# Patient Record
Sex: Female | Born: 1975 | Race: Black or African American | Hispanic: No | Marital: Married | Smoking: Never smoker
Health system: Southern US, Community
[De-identification: ages and names within clinical notes are randomized; demographics above are authoritative.]

## PROBLEM LIST (undated history)

## (undated) DIAGNOSIS — I10 Essential (primary) hypertension: Secondary | ICD-10-CM

## (undated) DIAGNOSIS — E119 Type 2 diabetes mellitus without complications: Secondary | ICD-10-CM

## (undated) HISTORY — PX: CHOLECYSTECTOMY: SHX55

---

## 2006-02-27 ENCOUNTER — Other Ambulatory Visit: Admission: RE | Admit: 2006-02-27 | Discharge: 2006-02-27 | Payer: Self-pay | Admitting: Gynecology

## 2007-06-10 ENCOUNTER — Other Ambulatory Visit: Admission: RE | Admit: 2007-06-10 | Discharge: 2007-06-10 | Payer: Self-pay | Admitting: Gynecology

## 2011-08-13 ENCOUNTER — Encounter (HOSPITAL_COMMUNITY): Payer: Self-pay | Admitting: Emergency Medicine

## 2011-08-13 ENCOUNTER — Emergency Department (HOSPITAL_COMMUNITY)
Admission: EM | Admit: 2011-08-13 | Discharge: 2011-08-13 | Disposition: A | Discharge status: Home / Self Care | Payer: BC Managed Care – PPO | Attending: Emergency Medicine | Admitting: Emergency Medicine

## 2011-08-13 ENCOUNTER — Emergency Department (HOSPITAL_COMMUNITY): Payer: BC Managed Care – PPO

## 2011-08-13 DIAGNOSIS — R1011 Right upper quadrant pain: Secondary | ICD-10-CM | POA: Insufficient documentation

## 2011-08-13 DIAGNOSIS — R109 Unspecified abdominal pain: Secondary | ICD-10-CM

## 2011-08-13 LAB — URINALYSIS, ROUTINE W REFLEX MICROSCOPIC
Glucose, UA: NEGATIVE mg/dL
Ketones, ur: NEGATIVE mg/dL
Leukocytes, UA: NEGATIVE
Nitrite: NEGATIVE
Protein, ur: NEGATIVE mg/dL
Urobilinogen, UA: 0.2 mg/dL (ref 0.0–1.0)

## 2011-08-13 LAB — COMPREHENSIVE METABOLIC PANEL
ALT: 21 U/L (ref 0–35)
AST: 17 U/L (ref 0–37)
Albumin: 3.7 g/dL (ref 3.5–5.2)
CO2: 27 mEq/L (ref 19–32)
Chloride: 101 mEq/L (ref 96–112)
Creatinine, Ser: 0.69 mg/dL (ref 0.50–1.10)
GFR calc non Af Amer: >90 mL/min (ref >90)
Sodium: 137 mEq/L (ref 135–145)
Total Bilirubin: 0.4 mg/dL (ref 0.3–1.2)

## 2011-08-13 LAB — CBC WITH DIFFERENTIAL/PLATELET
Basophils Absolute: 0 10*3/uL (ref 0.0–0.1)
Basophils Relative: 0 % (ref 0–1)
HCT: 38 % (ref 36.0–46.0)
Lymphocytes Relative: 45 % (ref 12–46)
MCHC: 32.9 g/dL (ref 30.0–36.0)
Monocytes Absolute: 0.4 10*3/uL (ref 0.1–1.0)
Neutro Abs: 2.6 10*3/uL (ref 1.7–7.7)
Neutrophils Relative %: 46 % (ref 43–77)
Platelets: 312 10*3/uL (ref 150–400)
RDW: 12.7 % (ref 11.5–15.5)
WBC: 5.6 10*3/uL (ref 4.0–10.5)

## 2011-08-13 LAB — PREGNANCY, URINE: Preg Test, Ur: NEGATIVE

## 2011-08-13 MED ORDER — MORPHINE SULFATE 4 MG/ML IJ SOLN
4.0000 mg | INTRAMUSCULAR | Status: DC | PRN
Start: 2011-08-13 — End: 2011-08-13
  Administered 2011-08-13: 4 mg via INTRAVENOUS
  Filled 2011-08-13: qty 1

## 2011-08-13 MED ORDER — IOHEXOL 300 MG/ML  SOLN
40.0000 mL | Freq: Once | INTRAMUSCULAR | Status: AC | PRN
  Administered 2011-08-13: 40 mL via ORAL

## 2011-08-13 MED ORDER — SODIUM CHLORIDE 0.9 % IV SOLN
INTRAVENOUS | Status: DC
  Administered 2011-08-13: 09:00:00 via INTRAVENOUS

## 2011-08-13 MED ORDER — HYDROCODONE-ACETAMINOPHEN 5-325 MG PO TABS: ORAL_TABLET | ORAL | Status: AC

## 2011-08-13 MED ORDER — IOHEXOL 300 MG/ML  SOLN
100.0000 mL | Freq: Once | INTRAMUSCULAR | Status: AC | PRN
  Administered 2011-08-13: 100 mL via INTRAVENOUS

## 2011-08-13 MED ORDER — ONDANSETRON HCL 4 MG PO TABS: 4.0000 mg | ORAL_TABLET | Freq: Three times a day (TID) | ORAL | Status: AC | PRN

## 2011-08-13 MED ORDER — ONDANSETRON HCL 4 MG/2ML IJ SOLN
4.0000 mg | INTRAMUSCULAR | Status: DC | PRN
  Administered 2011-08-13: 4 mg via INTRAVENOUS
  Filled 2011-08-13: qty 2

## 2011-08-13 NOTE — ED Notes (Signed)
Pt c/o intermittent ruq abd pain with n/v/d x 6 months. Pt states pain is worse today.

## 2011-08-13 NOTE — ED Provider Notes (Signed)
History  This chart was scribed for Laray Anger, DO by Bennett Scrape. This patient was seen in room [facility]  CSN: [id number]  Arrival date & time 08/13/11  4098   First MD Initiated Contact with Patient 08/13/11 0830      Chief Complaint  Patient presents with  . Abdominal Pain    The history is provided by the patient. No language interpreter was used.   Pt was seen at 0850.  Erika Navarro is a 36 y.o. female who presents to the Emergency Department complaining of gradual onset and persistence of multiple intermittent episodes of RUQ abdominal "pain" for the past 6 months.  Describes the pain as non-radiating, and has been associated with nausea, emesis and diarrhea.  Pt describes her diarrhea as "loose stools."  She states that the symptoms are worse at night. Denies that eating certain foods worsens the symptoms. She states she has been eval by her PCP for same, who told her that her symptoms were being caused by "a GI virus."  She denies fever, no CP/SOB, no cough, no back pain, no black or blood in stools or emesis.      History reviewed. No pertinent past medical history.  History reviewed. No pertinent past surgical history.   History  Substance Use Topics  . Smoking status: Never Smoker   . Smokeless tobacco: Not on file  . Alcohol Use: No    No OB history provided.  Review of Systems ROS: Statement: All systems negative except as marked or noted in the HPI; Constitutional: Negative for fever and chills. ; ; Eyes: Negative for eye pain, redness and discharge. ; ; ENMT: Negative for ear pain, hoarseness, nasal congestion, sinus pressure and sore throat. ; ; Cardiovascular: Negative for chest pain, palpitations, diaphoresis, dyspnea and peripheral edema. ; ; Respiratory: Negative for cough, wheezing and stridor. ; ; Gastrointestinal: +abd pain, N/V/D. Negative for blood in stool, hematemesis, jaundice and rectal bleeding. . ; ; Genitourinary: Negative for dysuria,  flank pain and hematuria. ; ; Musculoskeletal: Negative for back pain and neck pain. Negative for swelling and trauma.; ; Skin: Negative for pruritus, rash, abrasions, blisters, bruising and skin lesion.; ; Neuro: Negative for headache, lightheadedness and neck stiffness. Negative for weakness, altered level of consciousness , altered mental status, extremity weakness, paresthesias, involuntary movement, seizure and syncope.       Allergies  Review of patient's allergies indicates no known allergies.  Home Medications  No current outpatient prescriptions on file.  Triage Vitals: BP 131/77  Pulse 84  Temp 98.2 F (36.8 C)  Resp 16  Ht 5\' 6"  (1.676 m)  Wt 290 lb (131.543 kg)  BMI 46.81 kg/m2  SpO2 100%  Physical Exam 0855: Physical examination:  Nursing notes reviewed; Vital signs and O2 SAT reviewed;  Constitutional: Well developed, Well nourished, Well hydrated, In no acute distress; Head:  Normocephalic, atraumatic; Eyes: EOMI, PERRL, No scleral icterus; ENMT: Mouth and pharynx normal, Mucous membranes moist; Neck: Supple, Full range of motion, No lymphadenopathy; Cardiovascular: Regular rate and rhythm, No murmur, rub, or gallop; Respiratory: Breath sounds clear & equal bilaterally, No rales, rhonchi, wheezes.  Speaking full sentences with ease, Normal respiratory effort/excursion; Chest: Nontender, Movement normal; Abdomen: Soft, +mild RUQ tenderness to palp, no rebound or guarding. Nondistended, Normal bowel sounds;; Extremities: Pulses normal, No tenderness, No edema, No calf edema or asymmetry.; Neuro: AA&Ox3, Major CN grossly intact.  Speech clear. No gross focal motor or sensory deficits in extremities.; Skin: Color normal,  Warm, Dry.   ED Course  Procedures    MDM Reviewed: nursing note and vitals Interpretation: labs and CT scan   Results for orders placed during the hospital encounter of 08/13/11  PREGNANCY, URINE      Component Value Range   Preg Test, Ur NEGATIVE   NEGATIVE  URINALYSIS, ROUTINE W REFLEX MICROSCOPIC      Component Value Range   Color, Urine YELLOW  YELLOW   APPearance CLEAR  CLEAR   Specific Gravity, Urine <1.005 (*) 1.005 - 1.030   pH 6.0  5.0 - 8.0   Glucose, UA NEGATIVE  NEGATIVE mg/dL   Hgb urine dipstick NEGATIVE  NEGATIVE   Bilirubin Urine NEGATIVE  NEGATIVE   Ketones, ur NEGATIVE  NEGATIVE mg/dL   Protein, ur NEGATIVE  NEGATIVE mg/dL   Urobilinogen, UA 0.2  0.0 - 1.0 mg/dL   Nitrite NEGATIVE  NEGATIVE   Leukocytes, UA NEGATIVE  NEGATIVE  CBC WITH DIFFERENTIAL      Component Value Range   WBC 5.6  4.0 - 10.5 K/uL   RBC 4.41  3.87 - 5.11 MIL/uL   Hemoglobin 12.5  12.0 - 15.0 g/dL   HCT 57.8  46.9 - 62.9 %   MCV 86.2  78.0 - 100.0 fL   MCH 28.3  26.0 - 34.0 pg   MCHC 32.9  30.0 - 36.0 g/dL   RDW 52.8  41.3 - 24.4 %   Platelets 312  150 - 400 K/uL   Neutrophils Relative 46  43 - 77 %   Neutro Abs 2.6  1.7 - 7.7 K/uL   Lymphocytes Relative 45  12 - 46 %   Lymphs Abs 2.5  0.7 - 4.0 K/uL   Monocytes Relative 6  3 - 12 %   Monocytes Absolute 0.4  0.1 - 1.0 K/uL   Eosinophils Relative 2  0 - 5 %   Eosinophils Absolute 0.1  0.0 - 0.7 K/uL   Basophils Relative 0  0 - 1 %   Basophils Absolute 0.0  0.0 - 0.1 K/uL  COMPREHENSIVE METABOLIC PANEL      Component Value Range   Sodium 137  135 - 145 mEq/L   Potassium 3.7  3.5 - 5.1 mEq/L   Chloride 101  96 - 112 mEq/L   CO2 27  19 - 32 mEq/L   Glucose, Bld 154 (*) 70 - 99 mg/dL   BUN 9  6 - 23 mg/dL   Creatinine, Ser 0.10  0.50 - 1.10 mg/dL   Calcium 9.6  8.4 - 27.2 mg/dL   Total Protein 7.7  6.0 - 8.3 g/dL   Albumin 3.7  3.5 - 5.2 g/dL   AST 17  0 - 37 U/L   ALT 21  0 - 35 U/L   Alkaline Phosphatase 37 (*) 39 - 117 U/L   Total Bilirubin 0.4  0.3 - 1.2 mg/dL   GFR calc non Af Amer >90  >90 mL/min   GFR calc Af Amer >90  >90 mL/min  LIPASE, BLOOD      Component Value Range   Lipase 32  11 - 59 U/L    Ct Abdomen Pelvis W Contrast 08/13/2011  *RADIOLOGY REPORT*   Clinical Data: Right upper quadrant pain x 6 months  CT ABDOMEN AND PELVIS WITH CONTRAST  Technique:  Multidetector CT imaging of the abdomen and pelvis was performed following the standard protocol during bolus administration of intravenous contrast.  Contrast: OMNIPAQUE IOHEXOL 300 MG/ML  SOLN  Comparison: None.  Findings: Lung bases are clear.  Liver, spleen, pancreas, and adrenal glands are within normal limits.  Gallbladder is unremarkable.  No intrahepatic or extrahepatic ductal dilatation  Kidneys are within normal limits.  No hydronephrosis.  No evidence of bowel obstruction.  Normal appendix.  Mild colonic diverticulosis, without associated inflammatory changes.  No evidence of abdominal aortic aneurysm.  No abdominopelvic ascites.  No suspicious abdominopelvic lymphadenopathy.  Uterus and bilateral ovaries are unremarkable.  Bladder is within normal limits.  Mild degenerative changes of the visualized thoracolumbar spine.  IMPRESSION: Unremarkable CT abdomen pelvis.  No CT findings to account for the patient's chronic abdominal pain.  Original Report Authenticated By: Charline Bills, M.D.     1030:  VS remain stable.  Tol PO well while in the ED without N/V.  LFT's normal, no acute GB findings on CT scan.  Will tx symptomatically for now. Wants to go home now.  Dx testing d/w pt and family.  Questions answered.  Verb understanding, agreeable to d/c home with outpt f/u with PMD and GI MD.      I personally performed the services described in this documentation, which was scribed in my presence. The recorded information has been reviewed and considered. Sibley Rolison Allison Quarry, DO 08/14/11 1745

## 2011-08-13 NOTE — ED Notes (Signed)
Patient with no complaints at this time. Respirations even and unlabored. Skin warm/dry. Discharge instructions reviewed with patient at this time. Patient given opportunity to voice concerns/ask questions. IV removed per policy and band-aid applied to site. Patient discharged at this time and left Emergency Department with steady gait.  

## 2011-08-30 ENCOUNTER — Encounter: Payer: Self-pay | Admitting: Gastroenterology

## 2011-08-31 ENCOUNTER — Emergency Department (HOSPITAL_COMMUNITY)
Admission: EM | Admit: 2011-08-31 | Discharge: 2011-08-31 | Disposition: A | Discharge status: Home / Self Care | Payer: BC Managed Care – PPO | Attending: Emergency Medicine | Admitting: Emergency Medicine

## 2011-08-31 ENCOUNTER — Encounter (HOSPITAL_COMMUNITY): Payer: Self-pay | Admitting: Emergency Medicine

## 2011-08-31 DIAGNOSIS — R1011 Right upper quadrant pain: Secondary | ICD-10-CM | POA: Insufficient documentation

## 2011-08-31 DIAGNOSIS — R109 Unspecified abdominal pain: Secondary | ICD-10-CM

## 2011-08-31 MED ORDER — HYDROMORPHONE HCL PF 1 MG/ML IJ SOLN
0.5000 mg | Freq: Once | INTRAMUSCULAR | Status: AC
  Administered 2011-08-31: 0.5 mg via INTRAMUSCULAR
  Filled 2011-08-31: qty 1

## 2011-08-31 MED ORDER — KETOROLAC TROMETHAMINE 30 MG/ML IJ SOLN
30.0000 mg | Freq: Once | INTRAMUSCULAR | Status: AC
  Administered 2011-08-31: 30 mg via INTRAMUSCULAR
  Filled 2011-08-31: qty 1

## 2011-08-31 MED ORDER — HYDROMORPHONE HCL PF 1 MG/ML IJ SOLN
1.0000 mg | Freq: Once | INTRAMUSCULAR | Status: AC
  Administered 2011-08-31: 1 mg via INTRAMUSCULAR
  Filled 2011-08-31: qty 1

## 2011-08-31 NOTE — ED Notes (Signed)
Patient states was seen here a few weeks ago and diagnosed with gallstones; states has consultation with surgeon on Monday, but pain has increased.  Patient also c/o N/V/D.

## 2011-09-12 NOTE — ED Provider Notes (Signed)
History    35yF with abdominal pain. RUQ. Onset about about 6 months ago. Intermittent. Crampy to sharp. No appreciable exacerbating or relieving factors. No fever or chills. Sometimes associated with n/v. HAs been told that her gallbladder. Has not had surgical evaluation. No urinary complaints. No unusual vaginal bleeding or discharge.  CSN: [id number]  Arrival date & time 08/31/11  0022   First MD Initiated Contact with Patient 08/31/11 [phone]      Chief Complaint  Patient presents with  . Abdominal Pain    (Consider location/radiation/quality/duration/timing/severity/associated sxs/prior treatment) HPI  History reviewed. No pertinent past medical history.  History reviewed. No pertinent past surgical history.  No family history on file.  History  Substance Use Topics  . Smoking status: Never Smoker   . Smokeless tobacco: Not on file  . Alcohol Use: No    OB History    Grav Para Term Preterm Abortions TAB SAB Ect Mult Living                  Review of Systems   Review of symptoms negative unless otherwise noted in HPI.   Allergies  Review of patient's allergies indicates no known allergies.  Home Medications  No current outpatient prescriptions on file.  BP 111/72  Pulse 79  Temp 97.7 F (36.5 C) (Oral)  Resp 20  Ht 5\' 6"  (1.676 m)  Wt 290 lb (131.543 kg)  BMI 46.81 kg/m2  SpO2 95%  LMP 08/17/2011  Physical Exam  Nursing note and vitals reviewed. Constitutional: She appears well-developed and well-nourished. No distress.       Laying in bed. NAD. Obese.  HENT:  Head: Normocephalic and atraumatic.  Eyes: Conjunctivae are normal. Right eye exhibits no discharge. Left eye exhibits no discharge.  Neck: Neck supple.  Cardiovascular: Normal rate, regular rhythm and normal heart sounds.  Exam reveals no gallop and no friction rub.   No murmur heard. Pulmonary/Chest: Effort normal and breath sounds normal. No respiratory distress.  Abdominal: Soft. She  exhibits no distension and no mass. There is tenderness. There is no rebound and no guarding.       Mild tenderness in RUQ without rebound or guarding.  Genitourinary:       No cva tenderness  Musculoskeletal: She exhibits no edema and no tenderness.  Neurological: She is alert.  Skin: Skin is warm and dry.  Psychiatric: She has a normal mood and affect. Her behavior is normal. Thought content normal.    ED Course  Procedures (including critical care time)  Labs Reviewed - No data to display No results found.   1. Abdominal pain       MDM  35yF with abdominal pain. Likely biliary colic. Mild tenderness on exam without peritoneal signs. Doubt cholecystitis. Feeling better after meds in ED. Plan outpt surgical fu. Return precautions discussed.        Raeford Razor, MD 09/12/11 1312

## 2015-07-16 ENCOUNTER — Encounter (HOSPITAL_COMMUNITY): Payer: Self-pay | Admitting: Emergency Medicine

## 2015-07-16 ENCOUNTER — Emergency Department (HOSPITAL_COMMUNITY)
Admission: EM | Admit: 2015-07-16 | Discharge: 2015-07-16 | Disposition: A | Discharge status: Home / Self Care | Payer: BLUE CROSS/BLUE SHIELD | Attending: Emergency Medicine | Admitting: Emergency Medicine

## 2015-07-16 ENCOUNTER — Emergency Department (HOSPITAL_COMMUNITY): Payer: BLUE CROSS/BLUE SHIELD

## 2015-07-16 DIAGNOSIS — I10 Essential (primary) hypertension: Secondary | ICD-10-CM | POA: Diagnosis not present

## 2015-07-16 DIAGNOSIS — M545 Low back pain, unspecified: Secondary | ICD-10-CM

## 2015-07-16 DIAGNOSIS — E119 Type 2 diabetes mellitus without complications: Secondary | ICD-10-CM | POA: Diagnosis not present

## 2015-07-16 HISTORY — DX: Essential (primary) hypertension: I10

## 2015-07-16 HISTORY — DX: Type 2 diabetes mellitus without complications: E11.9

## 2015-07-16 LAB — POC URINE PREG, ED: Preg Test, Ur: NEGATIVE

## 2015-07-16 MED ORDER — HYDROMORPHONE HCL 2 MG/ML IJ SOLN: 2.0000 mg | Freq: Once | INTRAMUSCULAR | Status: DC

## 2015-07-16 MED ORDER — DICLOFENAC SODIUM 50 MG PO TBEC: 50.0000 mg | DELAYED_RELEASE_TABLET | Freq: Two times a day (BID) | ORAL | Status: AC

## 2015-07-16 MED ORDER — METHOCARBAMOL 500 MG PO TABS: 500.0000 mg | ORAL_TABLET | Freq: Four times a day (QID) | ORAL | Status: AC | PRN

## 2015-07-16 MED ORDER — KETOROLAC TROMETHAMINE 60 MG/2ML IM SOLN
60.0000 mg | Freq: Once | INTRAMUSCULAR | Status: AC
  Administered 2015-07-16: 60 mg via INTRAMUSCULAR
  Filled 2015-07-16: qty 2

## 2015-07-16 MED ORDER — HYDROMORPHONE HCL 2 MG/ML IJ SOLN
2.0000 mg | Freq: Once | INTRAMUSCULAR | Status: AC
  Administered 2015-07-16: 2 mg via INTRAMUSCULAR
  Filled 2015-07-16: qty 1

## 2015-07-16 MED ORDER — HYDROCODONE-ACETAMINOPHEN 5-325 MG PO TABS: 2.0000 | ORAL_TABLET | ORAL | Status: AC | PRN

## 2015-07-16 MED ORDER — ONDANSETRON 4 MG PO TBDP
4.0000 mg | ORAL_TABLET | Freq: Once | ORAL | Status: AC
  Administered 2015-07-16: 4 mg via ORAL
  Filled 2015-07-16: qty 1

## 2015-07-16 NOTE — ED Notes (Signed)
Back pain for last 2 days.  Rates pain 10/10.  Injury to back about 10 years ago.  Denies any falls.

## 2015-07-16 NOTE — ED Provider Notes (Signed)
CSN: [id number]     Arrival date & time 07/16/15  1220 History   First MD Initiated Contact with Patient 07/16/15 1259     Chief Complaint  Patient presents with  . Back Pain     (Consider location/radiation/quality/duration/timing/severity/associated sxs/prior Treatment) Patient is a 40 y.o. female presenting with back pain. The history is provided by the patient. No language interpreter was used.  Back Pain Location:  Lumbar spine Quality:  Aching Radiates to:  Does not radiate Pain severity:  Severe Pain is:  Same all the time Onset quality:  Gradual Timing:  Constant Progression:  Worsening Chronicity:  New Relieved by:  Nothing Worsened by:  Nothing tried Ineffective treatments:  None tried Associated symptoms: no leg pain     Past Medical History  Diagnosis Date  . Diabetes mellitus without complication (HCC)   . Hypertension    Past Surgical History  Procedure Laterality Date  . Cholecystectomy     History reviewed. No pertinent family history. Social History  Substance Use Topics  . Smoking status: Never Smoker   . Smokeless tobacco: None  . Alcohol Use: No   OB History    No data available     Review of Systems  Musculoskeletal: Positive for back pain.  All other systems reviewed and are negative.     Allergies  Review of patient's allergies indicates no known allergies.  Home Medications   Prior to Admission medications   Medication Sig Start Date End Date Taking? Authorizing Provider  diclofenac (VOLTAREN) 50 MG EC tablet Take 1 tablet (50 mg total) by mouth 2 (two) times daily. 07/16/15   Elson AreasLeslie K Pascual Mantel, PA-C  methocarbamol (ROBAXIN) 500 MG tablet Take 1 tablet (500 mg total) by mouth every 6 (six) hours as needed for muscle spasms. 07/16/15   Elson AreasLeslie K Jailan Trimm, PA-C   BP 124/72 mmHg  Pulse 79  Temp(Src) 98.9 F (37.2 C)  Resp 18  Ht 5\' 6"  (1.676 m)  Wt 131.09 kg  BMI 46.67 kg/m2  SpO2 100% Physical Exam  Constitutional: She appears  well-developed and well-nourished.  HENT:  Head: Normocephalic.  Eyes: Pupils are equal, round, and reactive to light.  Cardiovascular: Normal rate and normal heart sounds.   Pulmonary/Chest: Effort normal.  Abdominal: Soft.  Musculoskeletal: She exhibits tenderness.  Diffusely tender ls spine,  Pain with movement.   Neurological: She is alert.  Skin: Skin is warm.  Nursing note and vitals reviewed.   ED Course  Procedures (including critical care time) Labs Review Labs Reviewed  POC URINE PREG, ED    Imaging Review Dg Lumbar Spine Complete  07/16/2015  CLINICAL DATA:  Low back pain x1 week without trauma EXAM: LUMBAR SPINE - COMPLETE 4+ VIEW COMPARISON:  CT 08/13/2011 FINDINGS: There is no evidence of lumbar spine fracture. Alignment is normal. Intervertebral disc spaces are maintained. Mild anterior endplate spurring in the visualized lower thoracic spine. IUD in expected location. Cholecystectomy clips. IMPRESSION: Negative. Electronically Signed   By: Corlis Leak  Hassell M.D.   On: 07/16/2015 14:39   I have personally reviewed and evaluated these images and lab results as part of my medical decision-making.   EKG Interpretation None      MDM Pt given torodol and Dilaudid,  Pt able to stand and walk after pain medications   Final diagnoses:  Midline low back pain without sciatica    Meds ordered this encounter  Medications  . DISCONTD: HYDROmorphone (DILAUDID) injection 2 mg    Sig:   .  ketorolac (TORADOL) injection 60 mg    Sig:   . HYDROmorphone (DILAUDID) injection 2 mg    Sig:   . ondansetron (ZOFRAN-ODT) disintegrating tablet 4 mg    Sig:   . diclofenac (VOLTAREN) 50 MG EC tablet    Sig: Take 1 tablet (50 mg total) by mouth 2 (two) times daily.    Dispense:  20 tablet    Refill:  0    Order Specific Question:  Supervising Provider    Answer:  MILLER, BRIAN [[id number]]  . methocarbamol (ROBAXIN) 500 MG tablet    Sig: Take 1 tablet (500 mg total) by mouth every 6 (six)  hours as needed for muscle spasms.    Dispense:  20 tablet    Refill:  0    Order Specific Question:  Supervising Provider    Answer:  MILLER, BRIAN [[id number]]  . HYDROcodone-acetaminophen (NORCO/VICODIN) 5-325 MG tablet    Sig: Take 2 tablets by mouth every 4 (four) hours as needed.    Dispense:  20 tablet    Refill:  0    Order Specific Question:  Supervising Provider    Answer:  Eber HongMILLER, BRIAN [[id number]]  An After Visit Summary was printed and given to the patient.    Lonia SkinnerLeslie K FresnoSofia, PA-C 07/16/15 1528  Samuel JesterKathleen McManus, DO 07/21/15 1245

## 2015-07-16 NOTE — Discharge Instructions (Signed)

## 2017-07-22 IMAGING — DX DG LUMBAR SPINE COMPLETE 4+V
5 series · 5 of 5 positions shown · non-contrast
Comparison: CT 08/13/2011

CLINICAL DATA: Low back pain x1 week without trauma

EXAM:
LUMBAR SPINE - COMPLETE 4+ VIEW

[l-spine ap]
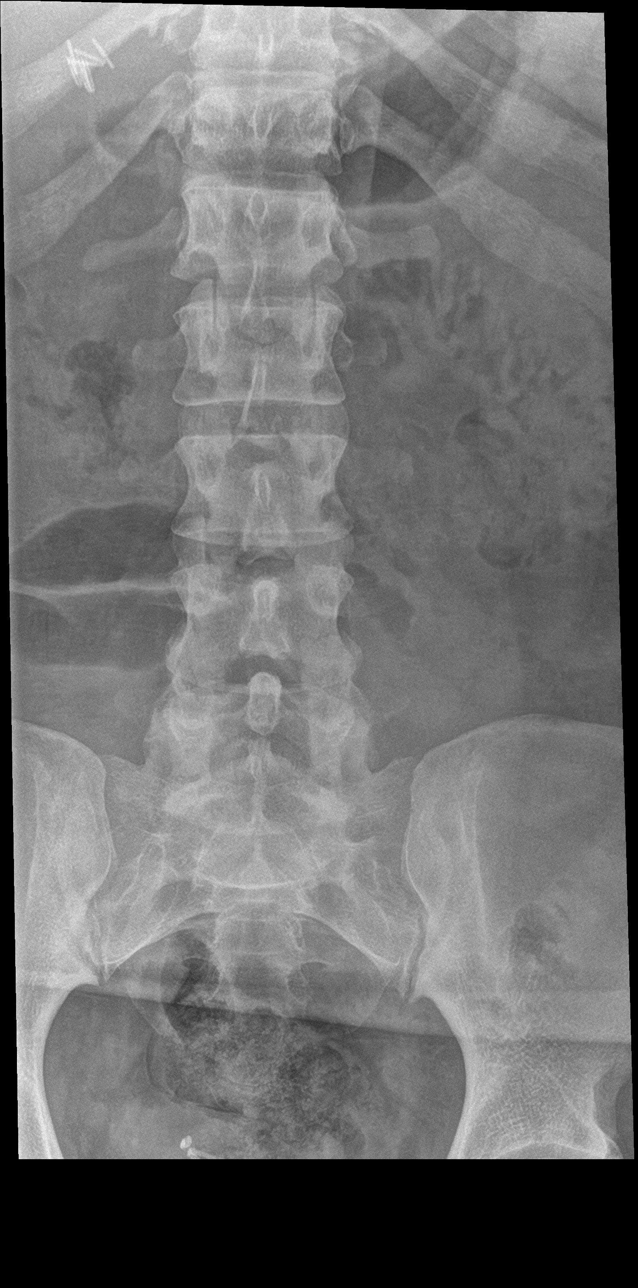

[l-spine obl (1 of 2)]
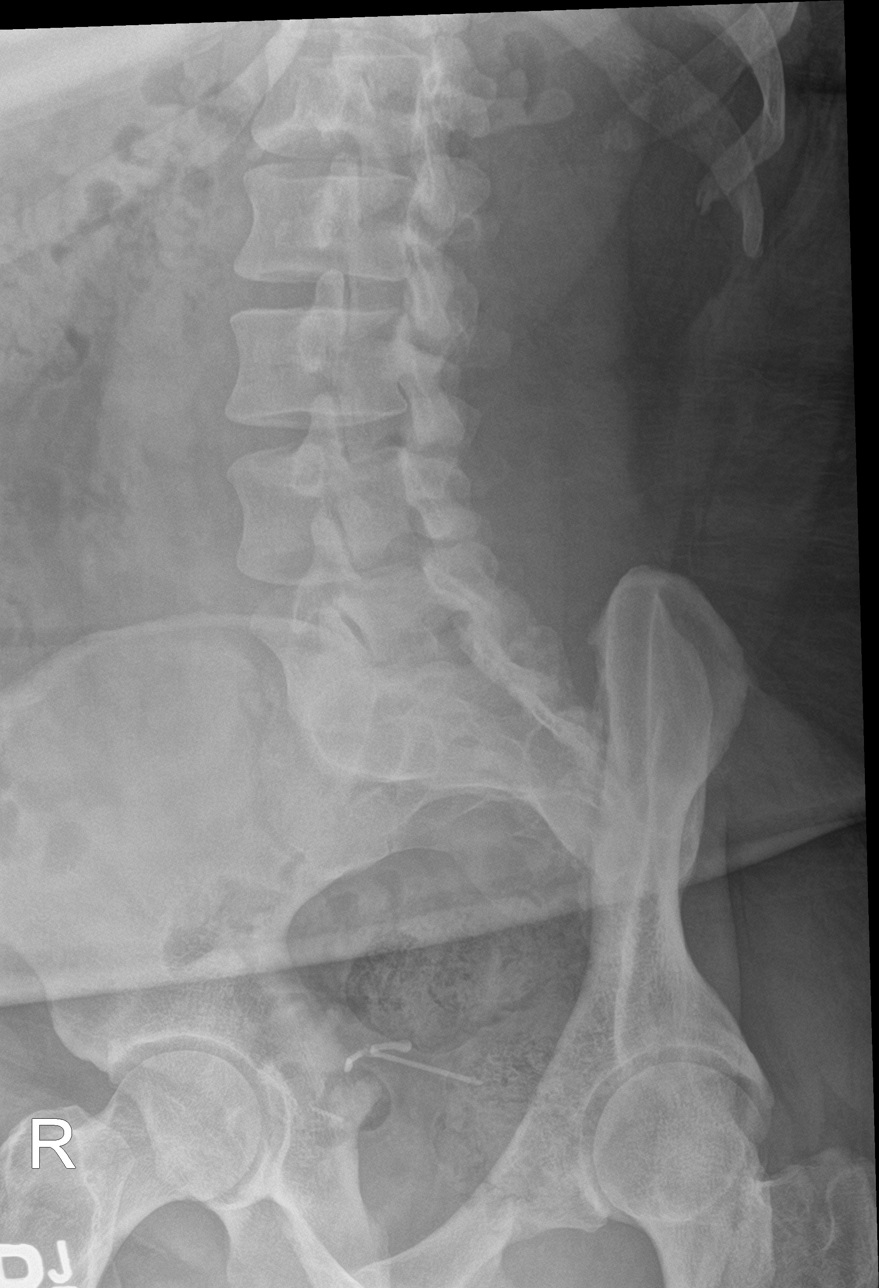

[l-spine obl (2 of 2)]
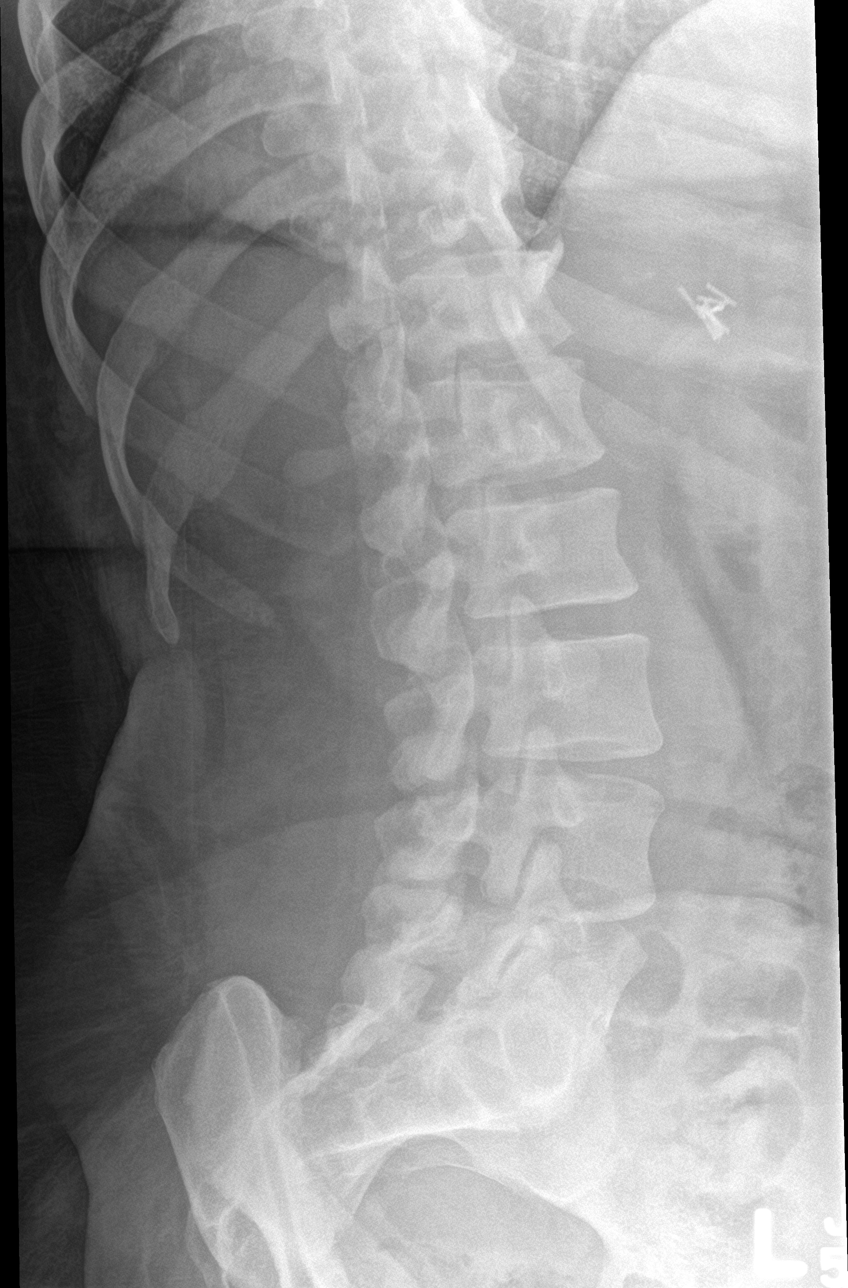

[l-spine lat]
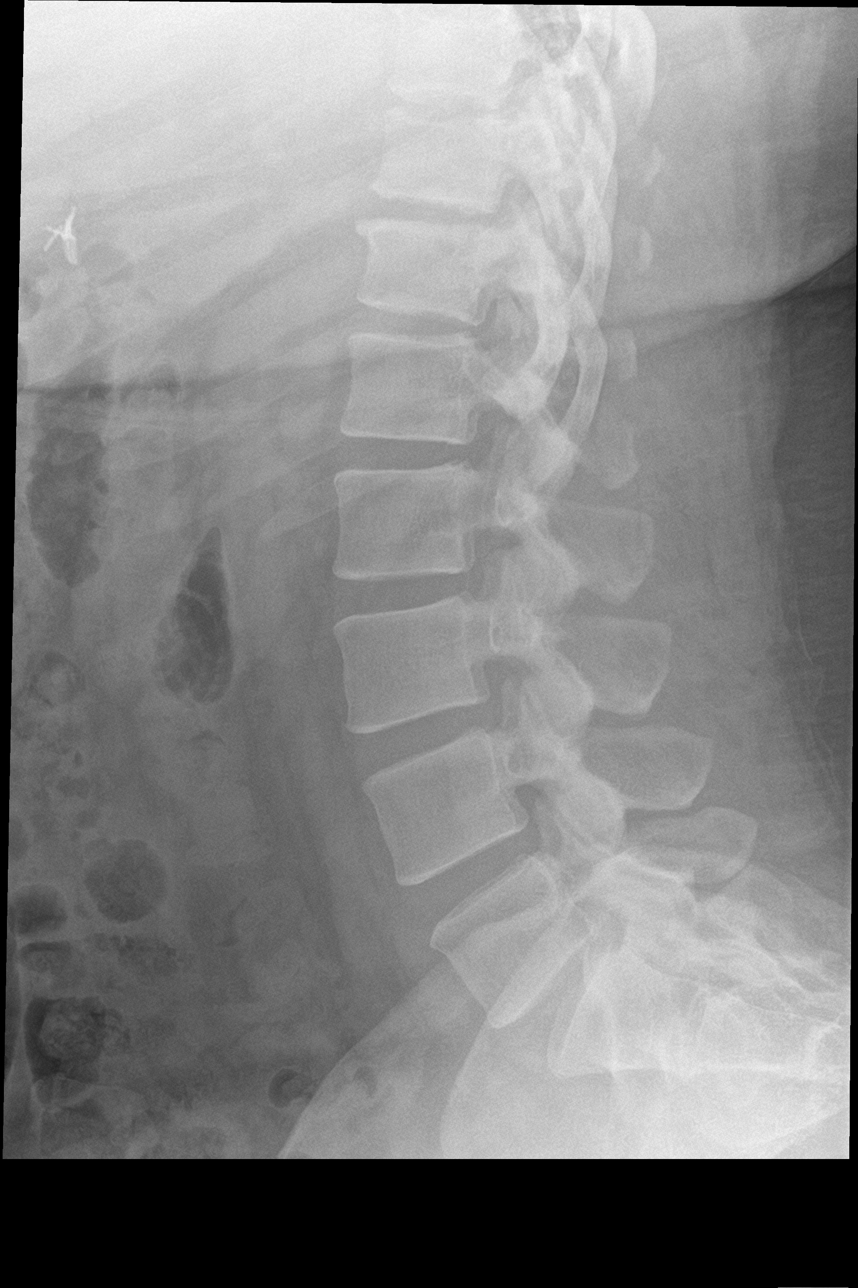

[l-spine spot]
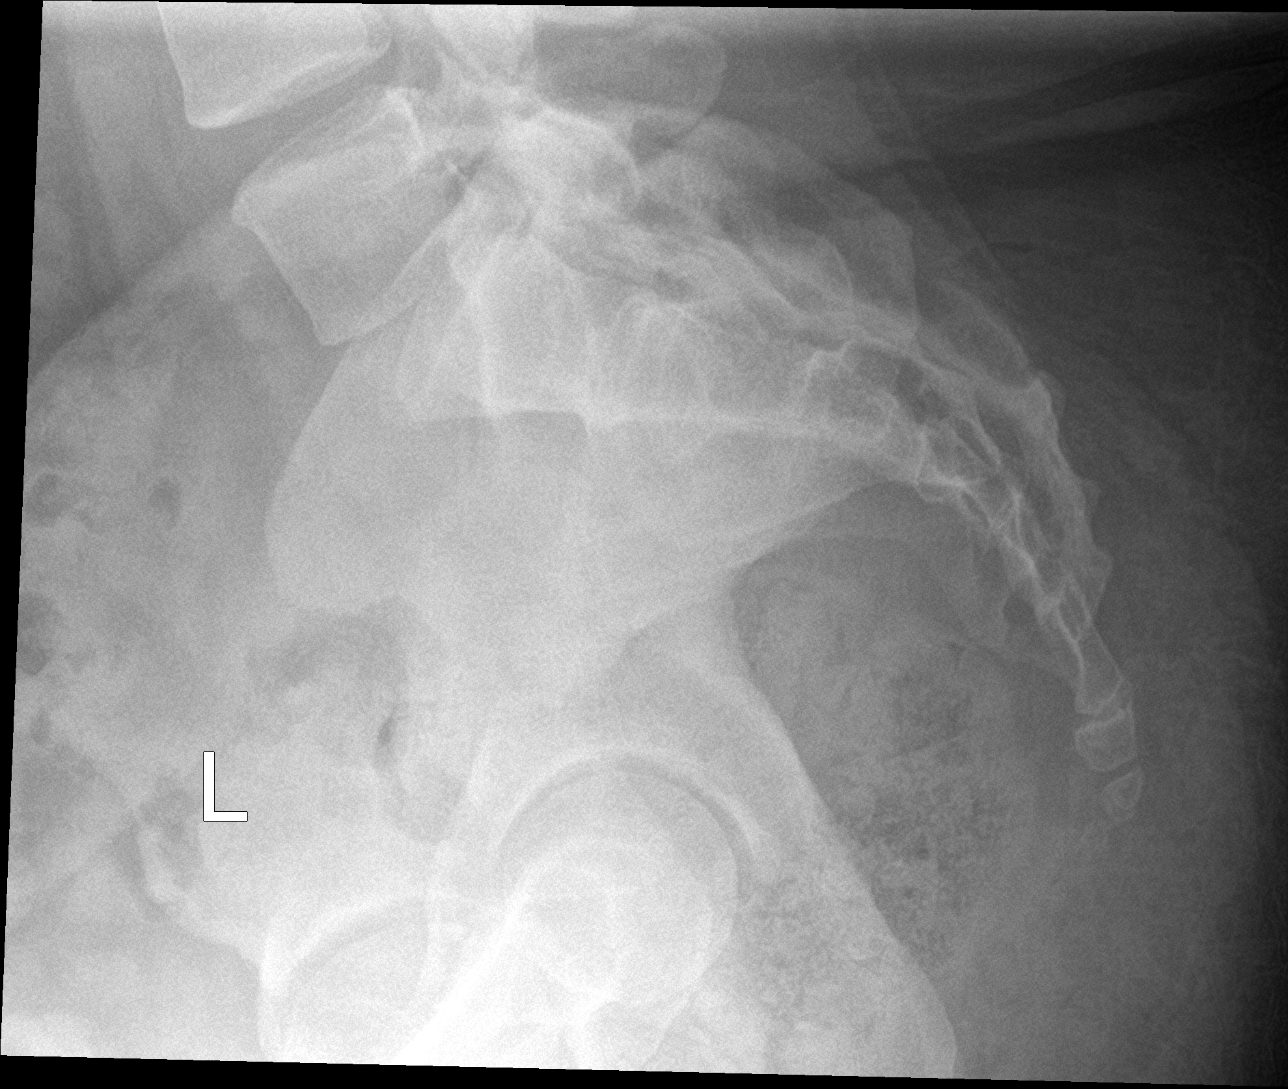

[5 of 5 positions shown; findings below may reference images not displayed]

FINDINGS: There is no evidence of lumbar spine fracture. Alignment is normal.
Intervertebral disc spaces are maintained. Mild anterior endplate
spurring in the visualized lower thoracic spine. IUD in expected
location. Cholecystectomy clips.
IMPRESSION: Negative.

## 2021-07-18 ENCOUNTER — Other Ambulatory Visit: Payer: Self-pay | Admitting: Obstetrics and Gynecology

## 2021-07-18 DIAGNOSIS — R928 Other abnormal and inconclusive findings on diagnostic imaging of breast: Secondary | ICD-10-CM
